# Patient Record
Sex: Male | Born: 1950 | Race: Black or African American | Hispanic: No | Smoking: Former smoker
Health system: Southern US, Community
[De-identification: ages and names within clinical notes are randomized; demographics above are authoritative.]

## PROBLEM LIST (undated history)

## (undated) DIAGNOSIS — E559 Vitamin D deficiency, unspecified: Secondary | ICD-10-CM

## (undated) DIAGNOSIS — R0989 Other specified symptoms and signs involving the circulatory and respiratory systems: Secondary | ICD-10-CM

## (undated) DIAGNOSIS — I5022 Chronic systolic (congestive) heart failure: Secondary | ICD-10-CM

## (undated) DIAGNOSIS — R0602 Shortness of breath: Secondary | ICD-10-CM

## (undated) DIAGNOSIS — L03115 Cellulitis of right lower limb: Secondary | ICD-10-CM

## (undated) DIAGNOSIS — I420 Dilated cardiomyopathy: Secondary | ICD-10-CM

## (undated) DIAGNOSIS — K635 Polyp of colon: Secondary | ICD-10-CM

## (undated) DIAGNOSIS — E669 Obesity, unspecified: Secondary | ICD-10-CM

## (undated) DIAGNOSIS — E7800 Pure hypercholesterolemia, unspecified: Secondary | ICD-10-CM

## (undated) DIAGNOSIS — E782 Mixed hyperlipidemia: Secondary | ICD-10-CM

## (undated) DIAGNOSIS — M79604 Pain in right leg: Secondary | ICD-10-CM

## (undated) DIAGNOSIS — I639 Cerebral infarction, unspecified: Secondary | ICD-10-CM

## (undated) DIAGNOSIS — Z95810 Presence of automatic (implantable) cardiac defibrillator: Secondary | ICD-10-CM

## (undated) DIAGNOSIS — M79605 Pain in left leg: Secondary | ICD-10-CM

## (undated) DIAGNOSIS — I509 Heart failure, unspecified: Secondary | ICD-10-CM

## (undated) DIAGNOSIS — E119 Type 2 diabetes mellitus without complications: Secondary | ICD-10-CM

## (undated) DIAGNOSIS — I10 Essential (primary) hypertension: Secondary | ICD-10-CM

## (undated) DIAGNOSIS — R609 Edema, unspecified: Secondary | ICD-10-CM

## (undated) HISTORY — DX: Dilated cardiomyopathy: I42.0

## (undated) HISTORY — DX: Pain in left leg: M79.605

## (undated) HISTORY — DX: Vitamin D deficiency, unspecified: E55.9

## (undated) HISTORY — PX: TONSILLECTOMY: SUR1361

## (undated) HISTORY — PX: EYE SURGERY: SHX253

## (undated) HISTORY — DX: Other specified symptoms and signs involving the circulatory and respiratory systems: R09.89

## (undated) HISTORY — DX: Shortness of breath: R06.02

## (undated) HISTORY — DX: Pain in right leg: M79.604

## (undated) HISTORY — DX: Chronic systolic (congestive) heart failure: I50.22

## (undated) HISTORY — DX: Edema, unspecified: R60.9

## (undated) HISTORY — DX: Mixed hyperlipidemia: E78.2

## (undated) HISTORY — DX: Polyp of colon: K63.5

## (undated) HISTORY — DX: Pure hypercholesterolemia, unspecified: E78.00

## (undated) HISTORY — DX: Cerebral infarction, unspecified: I63.9

## (undated) HISTORY — DX: Cellulitis of right lower limb: L03.115

## (undated) HISTORY — DX: Essential (primary) hypertension: I10

## (undated) HISTORY — PX: COLONOSCOPY W/ BIOPSIES AND POLYPECTOMY: SHX1376

## (undated) HISTORY — DX: Obesity, unspecified: E66.9

---

## 2014-04-12 DIAGNOSIS — I639 Cerebral infarction, unspecified: Secondary | ICD-10-CM

## 2014-04-12 HISTORY — DX: Cerebral infarction, unspecified: I63.9

## 2015-12-10 ENCOUNTER — Encounter: Payer: Self-pay | Admitting: *Deleted

## 2015-12-11 ENCOUNTER — Ambulatory Visit (INDEPENDENT_AMBULATORY_CARE_PROVIDER_SITE_OTHER): Payer: BLUE CROSS/BLUE SHIELD | Admitting: Cardiology

## 2015-12-11 ENCOUNTER — Encounter: Payer: Self-pay | Admitting: Cardiology

## 2015-12-11 ENCOUNTER — Encounter (INDEPENDENT_AMBULATORY_CARE_PROVIDER_SITE_OTHER): Payer: Self-pay

## 2015-12-11 VITALS — BP 138/72 | HR 81 | Ht 67.0 in | Wt 207.6 lb

## 2015-12-11 DIAGNOSIS — I509 Heart failure, unspecified: Secondary | ICD-10-CM

## 2015-12-11 MED ORDER — LISINOPRIL 20 MG PO TABS: 20.0000 mg | ORAL_TABLET | Freq: Every day | ORAL | 1 refills | Status: DC

## 2015-12-11 MED ORDER — CARVEDILOL 6.25 MG PO TABS: 6.2500 mg | ORAL_TABLET | Freq: Two times a day (BID) | ORAL | 1 refills | Status: DC

## 2015-12-11 NOTE — Patient Instructions (Addendum)
Medication Instructions:  Your physician has recommended you make the following change in your medication:  1) INCREASE Lisinopril 20 mg daily 2) INCREASE Carvedilol to 6.25 mg twice daily  Labwork: None ordered  Testing/Procedures: Your physician has requested that you have an echocardiogram in 3 months (before next office visit with Dr. Elberta Fortis). Echocardiography is a painless test that uses sound waves to create images of your heart. It provides your doctor with information about the size and shape of your heart and how well your heart's chambers and valves are working. This procedure takes approximately one hour. There are no restrictions for this procedure.  Follow-Up: Your physician recommends that you schedule a follow-up appointment in: 3 months with Dr. Elberta Fortis. (after you have completed your echocardiogram)  If you need a refill on your cardiac medications before your next appointment, please call your pharmacy.  Thank you for choosing [facility]!!   Dory Horn, RN  [phone]  Any Other Special Instructions Will Be Listed Below (If Applicable).   Cardioverter Defibrillator Implantation An implantable cardioverter defibrillator (ICD) is a small, lightweight, battery-powered device that is placed (implanted) under the skin in the chest or abdomen. Your caregiver may prescribe an ICD if:  You have had an irregular heart rhythm (arrhythmia) that originated in the lower chambers of the heart (ventricles).  Your heart has been damaged by a disease (such as coronary artery disease) or heart condition (such as a heart attack). An ICD consists of a battery that lasts several years, a small computer called a pulse generator, and wires called leads that go into the heart. It is used to detect and correct two dangerous arrhythmias: a rapid heart rhythm (tachycardia) and an arrhythmia in which the ventricles contract in an uncoordinated way (fibrillation). When an ICD detects  tachycardia, it sends an electrical signal to the heart that restores the heartbeat to normal (cardioversion). This signal is usually painless. If cardioversion does not work or if the ICD detects fibrillation, it delivers a small electrical shock to the heart (defibrillation) to restart the heart. The shock may feel like a strong jolt in the chest.ICDs may be programmed to correct other problems. Sometimes, ICDs are programmed to act as another type of implantable device called a pacemaker. Pacemakers are used to treat a slow heartbeat (bradycardia). LET YOUR CAREGIVER KNOW ABOUT:  Any allergies you have.  All medicines you are taking, including vitamins, herbs, eyedrops, and over-the-counter medicines and creams.  Previous problems you or members of your family have had with the use of anesthetics.  Any blood disorders you have had.  Other health problems you have. RISKS AND COMPLICATIONS Generally, the procedure to implant an ICD is safe. However, as with any surgical procedure, complications can occur. Possible complications associated with implanting an ICD include:  Swelling, bleeding, or bruising at the site where the ICD was implanted.  Infection at the site where the ICD was implanted.  A reaction to medicine used during the procedure.  Nerve, heart, or blood vessel damage.  Blood clots. BEFORE THE PROCEDURE  You may need to have blood tests, heart tests, or a chest X-ray done before the day of the procedure.  Ask your caregiver about changing or stopping your regular medicines.  Make plans to have someone drive you home. You may need to stay in the hospital overnight after the procedure.  Stop smoking at least 24 hours before the procedure.  Take a bath or shower the night before the procedure. You  may need to scrub your chest or abdomen with a special type of soap.  Do not eat or drink before your procedure for as long as directed by your caregiver. Ask if it is okay  to take any needed medicine with a small sip of water. PROCEDURE  The procedure to implant an ICD in your chest or abdomen is usually done at a hospital in a room that has a large X-ray machine called a fluoroscope. The machine will be above you during the procedure. It will help your caregiver see your heart during the procedure. Implanting an ICD usually takes 1-3 hours. Before the procedure:   Small monitors will be put on your body. They will be used to check your heart, blood pressure, and oxygen level.  A needle will be put into a vein in your hand or arm. This is called an intravenous (IV) access tube. Fluids and medicine will flow directly into your body through the IV tube.  Your chest or abdomen will be cleaned with a germ-killing (antiseptic) solution. The area may be shaved.  You may be given medicine to help you relax (sedative).  You will be given a medicine called a local anesthetic. This medicine will make the surgical site numb while the ICD is implanted. You will be sleepy but awake during the procedure. After you are numb the procedure will begin. The caregiver will:  Make a small cut (incision). This will make a pocket deep under your skin that will hold the pulse generator.  Guide the leads through a large blood vessel into your heart and attach them to the heart muscles. Depending on the ICD, the leads may go into one ventricle or they may go to both ventricles and into an upper chamber of the heart (atrium).  Test the ICD.  Close the incision with stitches, glue, or staples. AFTER THE PROCEDURE  You may feel pain. Some pain is normal. It may last a few days.  You may stay in a recovery area until the local anesthetic has worn off. Your blood pressure and pulse will be checked often. You will be taken to a room where your heart will be monitored.  A chest X-ray will be taken. This is done to check that the cardioverter defibrillator is in the right place.  You may  stay in the hospital overnight.  A slight bump may be seen over the skin where the ICD was placed. Sometimes, it is possible to feel the ICD under the skin. This is normal.  In the months and years afterward, your caregiver will check the device, the leads, and the battery every few months. Eventually, when the battery is low, the ICD will be replaced.   This information is not intended to replace advice given to you by your health care provider. Make sure you discuss any questions you have with your health care provider.   Document Released: 12/19/2001 Document Revised: 01/17/2013 Document Reviewed: 04/17/2012 Elsevier Interactive Patient Education [employer]

## 2015-12-11 NOTE — Progress Notes (Signed)
Electrophysiology Office Note   Date:  12/11/2015   ID:  Timothy Green, DOB 06-22-1950, MRN [id number]  PCP:  Doreen Salvage, PA-C  Cardiologist:  Hanley Hays Primary Electrophysiologist:  Shakea Isip Jorja Loa, MD    Chief Complaint  Patient presents with  . Advice Only    CHF     History of Present Illness: Timothy Green is a 65 y.o. male who presents today for electrophysiology evaluation.   History of controlled diabetes, hyperlipidemia, hypertension, and systolic heart failure. He is apparently had a known cardiomyopathy for the last 10 years but was lost to cardiology follow-up. It is reportedly nonischemic but the patient has chronic hypertension and diabetes. He takes Coumadin after a CVA of unclear etiology. He had an echocardiogram that showed an EF of 25-30% with decreased RV systolic function.  Today, he denies symptoms of palpitations, chest pain, shortness of breath, orthopnea, PND, claudication, dizziness, presyncope, syncope, bleeding, or neurologic sequela. The patient is tolerating medications without difficulties and is otherwise without complaint today.    Past Medical History:  Diagnosis Date  . Bilateral lower extremity pain   . Bruit (arterial)   . Cardiomyopathy, dilated, nonischemic (HCC)   . Cellulitis of right lower extremity   . Chronic systolic heart failure (HCC)   . Colon polyps   . Controlled diabetes mellitus (HCC)   . Edema   . Hypertension, essential   . Ischemic stroke (HCC)   . Mixed hyperlipidemia   . Obesity   . Pure hypercholesterolemia   . Shortness of breath   . Vitamin D deficiency    History reviewed. No pertinent surgical history.   Current Outpatient Prescriptions  Medication Sig Dispense Refill  . atorvastatin (LIPITOR) 40 MG tablet Take 40 mg by mouth daily.    . furosemide (LASIX) 40 MG tablet Take 60 mg by mouth.    Marland Kitchen glipiZIDE (GLUCOTROL) 10 MG tablet Take 10 mg by mouth daily before breakfast.    . MULTIPLE VITAMIN PO  Take 1 tablet by mouth daily.    . potassium chloride (K-DUR) 10 MEQ tablet Take 10 mEq by mouth 2 (two) times daily.    . WARFARIN SODIUM PO Take by mouth as directed. Per [facility]     No current facility-administered medications for this visit.     Allergies:   Review of patient's allergies indicates no known allergies.   Social History:  The patient  reports that he has quit smoking. He has never used smokeless tobacco. He reports that he drinks alcohol. He reports that he does not use drugs.   Family History:  The patient's family history includes Diabetes Mellitus II in his mother; Heart disease in his father and mother; Hypertension in his mother.    ROS:  Please see the history of present illness.   Otherwise, review of systems is positive for leg swelling.   All other systems are reviewed and negative.    PHYSICAL EXAM: VS:  BP 138/72   Pulse 81   Ht 5\' 7"  (1.702 m)   Wt 207 lb 9.6 oz (94.2 kg)   BMI 32.51 kg/m  , BMI Body mass index is 32.51 kg/m. GEN: Well nourished, well developed, in no acute distress  HEENT: normal  Neck: no JVD, carotid bruits, or masses Cardiac: RRR; no murmurs, rubs, or gallops,no edema  Respiratory:  clear to auscultation bilaterally, normal work of breathing GI: soft, nontender, nondistended, + BS MS: no deformity or atrophy  Skin: warm and dry Neuro:  Strength and sensation are intact Psych: euthymic mood, full affect  EKG:  EKG is ordered today. Personal review of the ekg ordered shows sinus rhythm, low voltage, PVC, nonspecific T wave abnormality  Recent Labs: No results found for requested labs within last 8760 hours.    Lipid Panel  No results found for: CHOL, TRIG, HDL, CHOLHDL, VLDL, LDLCALC, LDLDIRECT   Wt Readings from Last 3 Encounters:  12/11/15 207 lb 9.6 oz (94.2 kg)      Other studies Reviewed: Additional studies/ records that were reviewed today include: TTE 12/02/15  Review of the above records today  demonstrates:  Moderately dilated left ventricle, EF 25-30% Markedly dilated left atrium Mildly enlarged right ventricle, moderately impaired  mild pulmonary hypertension Normal valvular anatomy and function   ASSESSMENT AND PLAN:  1.  Chronic systolic heart failure: It appears that he has had heart failure for quite some time, though he is not on optimized medical therapy for his heart failure today. We did discuss the option of defibrillator. We discussed the risks and benefits of the procedure. Risks include bleeding, infection, tamponade, and heart block. We also discussed the option of adjusting medications to see if his EF would improve. We Timothy Green increase his Coreg to 6.25 twice daily and increase his lisinopril to 20 mg. We Timothy Green see him back in 3 months with an echocardiogram to further determine if his EF has improved. If it is not we Timothy Green plan for defibrillator placement.  2. Hypertension: Upper limits of normal today. Adjusting carvedilol and lisinopril for his heart failure.  3. Hyperlipidemia: Continue atorvastatin    Current medicines are reviewed at length with the patient today.   The patient does not have concerns regarding his medicines.  The following changes were made today:  Increase coreg, lisinopril  Labs/ tests ordered today include:  No orders of the defined types were placed in this encounter.    Disposition:   FU with Timothy Green 3 months  Signed, Timothy Green Jorja LoaMartin Esiah Bazinet, MD  12/11/2015 11:03 AM     [facility] [address] [place] [address] [phone] (office) [phone] (fax)

## 2016-03-08 ENCOUNTER — Other Ambulatory Visit (HOSPITAL_COMMUNITY): Payer: BLUE CROSS/BLUE SHIELD

## 2016-03-11 ENCOUNTER — Ambulatory Visit: Payer: BLUE CROSS/BLUE SHIELD | Admitting: Cardiology

## 2016-04-28 ENCOUNTER — Telehealth (HOSPITAL_COMMUNITY): Payer: Self-pay | Admitting: Radiology

## 2016-04-28 NOTE — Telephone Encounter (Signed)
Spoke with patient he will call back to reschedule.

## 2016-04-29 ENCOUNTER — Other Ambulatory Visit (HOSPITAL_COMMUNITY): Payer: BLUE CROSS/BLUE SHIELD

## 2016-05-13 ENCOUNTER — Telehealth (HOSPITAL_COMMUNITY): Payer: Self-pay | Admitting: Radiology

## 2016-05-13 ENCOUNTER — Encounter (HOSPITAL_COMMUNITY): Payer: Self-pay | Admitting: Radiology

## 2016-05-13 NOTE — Telephone Encounter (Signed)
left message to schedule echocardiogram  

## 2016-05-19 ENCOUNTER — Telehealth (HOSPITAL_COMMUNITY): Payer: Self-pay | Admitting: Cardiology

## 2016-06-09 ENCOUNTER — Other Ambulatory Visit: Payer: Self-pay | Admitting: Cardiology

## 2016-06-10 NOTE — Telephone Encounter (Signed)
Left patient message to call office to discuss scheduling overdue OV + echo

## 2016-09-09 NOTE — Telephone Encounter (Signed)
Left another message.

## 2016-10-27 ENCOUNTER — Encounter: Payer: Self-pay | Admitting: Cardiology

## 2016-10-27 NOTE — Telephone Encounter (Signed)
Certified letter mailed to home address asking pt to call and reschedule echo and OV.

## 2016-11-16 ENCOUNTER — Encounter: Payer: Self-pay | Admitting: Cardiology

## 2016-12-01 ENCOUNTER — Other Ambulatory Visit: Payer: Self-pay | Admitting: Cardiology

## 2016-12-05 ENCOUNTER — Other Ambulatory Visit: Payer: Self-pay | Admitting: Cardiology

## 2016-12-20 ENCOUNTER — Telehealth: Payer: Self-pay

## 2016-12-20 NOTE — Telephone Encounter (Signed)
Sent notes to scheduling 

## 2017-01-05 ENCOUNTER — Encounter: Payer: Self-pay | Admitting: Cardiology

## 2017-01-05 ENCOUNTER — Encounter: Payer: Self-pay | Admitting: *Deleted

## 2017-01-05 ENCOUNTER — Ambulatory Visit (INDEPENDENT_AMBULATORY_CARE_PROVIDER_SITE_OTHER): Payer: BLUE CROSS/BLUE SHIELD | Admitting: Cardiology

## 2017-01-05 ENCOUNTER — Other Ambulatory Visit: Payer: Self-pay | Admitting: Cardiology

## 2017-01-05 VITALS — BP 129/83 | HR 72 | Ht 67.0 in | Wt 209.8 lb

## 2017-01-05 DIAGNOSIS — E785 Hyperlipidemia, unspecified: Secondary | ICD-10-CM | POA: Insufficient documentation

## 2017-01-05 DIAGNOSIS — E782 Mixed hyperlipidemia: Secondary | ICD-10-CM

## 2017-01-05 DIAGNOSIS — I10 Essential (primary) hypertension: Secondary | ICD-10-CM | POA: Diagnosis not present

## 2017-01-05 DIAGNOSIS — I5022 Chronic systolic (congestive) heart failure: Secondary | ICD-10-CM | POA: Diagnosis not present

## 2017-01-05 DIAGNOSIS — I428 Other cardiomyopathies: Secondary | ICD-10-CM

## 2017-01-05 NOTE — Patient Instructions (Addendum)
Medication Instructions:    Your physician recommends that you continue on your current medications as directed. Please refer to the Current Medication list given to you today.  --- If you need a refill on your cardiac medications before your next appointment, please call your pharmacy. ---  Labwork:  None ordered  Testing/Procedures: Your physician has recommended that you have a defibrillator inserted. An implantable cardioverter defibrillator (ICD) is a small device that is placed in your chest or, in rare cases, your abdomen. This device uses electrical pulses or shocks to help control life-threatening, irregular heartbeats that could lead the heart to suddenly stop beating (sudden cardiac arrest). Leads are attached to the ICD that goes into your heart. This is done in the hospital and usually requires an overnight stay. Please see the instruction sheet given to you today for more information.  Follow-Up:  Your physician recommends that you schedule a follow-up appointment on 10/24 with Dr. Elberta Fortis.    Your physician recommends that you schedule a wound check appointment 10-14 days, after your procedure on 02/08/2017, with the device clinic in [place] office.   Your physician recommends that you schedule a follow up appointment in 3 months, after your procedure on 02/08/2017, with Dr. Elberta Fortis in [facility] office.  Thank you for choosing [facility]!!   Dory Horn, RN [phone]  Any Other Special Instructions Will Be Listed Below (If Applicable).   Cardioverter Defibrillator Implantation An implantable cardioverter defibrillator (ICD) is a small, lightweight, battery-powered device that is placed (implanted) under the skin in the chest or abdomen. Your caregiver may prescribe an ICD if:  You have had an irregular heart rhythm (arrhythmia) that originated in the lower chambers of the heart (ventricles).  Your heart has been damaged by a disease (such as coronary  artery disease) or heart condition (such as a heart attack). An ICD consists of a battery that lasts several years, a small computer called a pulse generator, and wires called leads that go into the heart. It is used to detect and correct two dangerous arrhythmias: a rapid heart rhythm (tachycardia) and an arrhythmia in which the ventricles contract in an uncoordinated way (fibrillation). When an ICD detects tachycardia, it sends an electrical signal to the heart that restores the heartbeat to normal (cardioversion). This signal is usually painless. If cardioversion does not work or if the ICD detects fibrillation, it delivers a small electrical shock to the heart (defibrillation) to restart the heart. The shock may feel like a strong jolt in the chest.ICDs may be programmed to correct other problems. Sometimes, ICDs are programmed to act as another type of implantable device called a pacemaker. Pacemakers are used to treat a slow heartbeat (bradycardia). LET YOUR CAREGIVER KNOW ABOUT:  Any allergies you have.  All medicines you are taking, including vitamins, herbs, eyedrops, and over-the-counter medicines and creams.  Previous problems you or members of your family have had with the use of anesthetics.  Any blood disorders you have had.  Other health problems you have. RISKS AND COMPLICATIONS Generally, the procedure to implant an ICD is safe. However, as with any surgical procedure, complications can occur. Possible complications associated with implanting an ICD include:  Swelling, bleeding, or bruising at the site where the ICD was implanted.  Infection at the site where the ICD was implanted.  A reaction to medicine used during the procedure.  Nerve, heart, or blood vessel damage.  Blood clots. BEFORE THE PROCEDURE  You may need to have blood  tests, heart tests, or a chest X-ray done before the day of the procedure.  Ask your caregiver about changing or stopping your regular  medicines.  Make plans to have someone drive you home. You may need to stay in the hospital overnight after the procedure.  Stop smoking at least 24 hours before the procedure.  Take a bath or shower the night before the procedure. You may need to scrub your chest or abdomen with a special type of soap.  Do not eat or drink before your procedure for as long as directed by your caregiver. Ask if it is okay to take any needed medicine with a small sip of water. PROCEDURE  The procedure to implant an ICD in your chest or abdomen is usually done at a hospital in a room that has a large X-ray machine called a fluoroscope. The machine will be above you during the procedure. It will help your caregiver see your heart during the procedure. Implanting an ICD usually takes 1-3 hours. Before the procedure:   Small monitors will be put on your body. They will be used to check your heart, blood pressure, and oxygen level.  A needle will be put into a vein in your hand or arm. This is called an intravenous (IV) access tube. Fluids and medicine will flow directly into your body through the IV tube.  Your chest or abdomen will be cleaned with a germ-killing (antiseptic) solution. The area may be shaved.  You may be given medicine to help you relax (sedative).  You will be given a medicine called a local anesthetic. This medicine will make the surgical site numb while the ICD is implanted. You will be sleepy but awake during the procedure. After you are numb the procedure will begin. The caregiver will:  Make a small cut (incision). This will make a pocket deep under your skin that will hold the pulse generator.  Guide the leads through a large blood vessel into your heart and attach them to the heart muscles. Depending on the ICD, the leads may go into one ventricle or they may go to both ventricles and into an upper chamber of the heart (atrium).  Test the ICD.  Close the incision with stitches, glue,  or staples. AFTER THE PROCEDURE  You may feel pain. Some pain is normal. It may last a few days.  You may stay in a recovery area until the local anesthetic has worn off. Your blood pressure and pulse will be checked often. You will be taken to a room where your heart will be monitored.  A chest X-ray will be taken. This is done to check that the cardioverter defibrillator is in the right place.  You may stay in the hospital overnight.  A slight bump may be seen over the skin where the ICD was placed. Sometimes, it is possible to feel the ICD under the skin. This is normal.  In the months and years afterward, your caregiver will check the device, the leads, and the battery every few months. Eventually, when the battery is low, the ICD will be replaced.   This information is not intended to replace advice given to you by your health care provider. Make sure you discuss any questions you have with your health care provider.   Document Released: 12/19/2001 Document Revised: 01/17/2013 Document Reviewed: 04/17/2012 Elsevier Interactive Patient Education [employer]

## 2017-01-05 NOTE — Progress Notes (Signed)
Electrophysiology Office Note   Date:  01/05/2017   ID:  Timothy Green, DOB [birth year], MRN [id number]  PCP:  Doreen Salvage, PA-C  Cardiologist:  Hanley Hays Primary Electrophysiologist:  Carollyn Etcheverry Jorja Loa, MD    Chief Complaint  Patient presents with  . Follow-up    Chronic systolic HF/abnormal stress     History of Present Illness: Timothy Green is a 66 y.o. male who presents today for electrophysiology evaluation.   History of controlled diabetes, hyperlipidemia, hypertension, and systolic heart failure. He is apparently had a known cardiomyopathy for the last 10 years but was lost to cardiology follow-up. It is reportedly nonischemic but the patient has chronic hypertension and diabetes. He takes Coumadin after a CVA of unclear etiology. At his last visit he was not on optimal medical therapy and thus lisinopril was added. Repeat echocardiogram was unchanged with a Myoview showing mainly fixed defects with some mild peri-infarct anterior ischemia.  Today, denies symptoms of palpitations, chest pain, shortness of breath, orthopnea, PND, lower extremity edema, claudication, dizziness, presyncope, syncope, bleeding, or neurologic sequela. The patient is tolerating medications without difficulties. He was started on chest ON is felt much better since that time. He is having much less weakness and fatigue. His volume status is improved after increases of his carvedilol and his Lasix.     Past Medical History:  Diagnosis Date  . Bilateral lower extremity pain   . Bruit (arterial)   . Cardiomyopathy, dilated, nonischemic (HCC)   . Cellulitis of right lower extremity   . Chronic systolic heart failure (HCC)   . Colon polyps   . Controlled diabetes mellitus (HCC)   . Edema   . Hypertension, essential   . Ischemic stroke (HCC)   . Mixed hyperlipidemia   . Obesity   . Pure hypercholesterolemia   . Shortness of breath   . Vitamin D deficiency    No past surgical history on  file.   Current Outpatient Prescriptions  Medication Sig Dispense Refill  . atorvastatin (LIPITOR) 40 MG tablet Take 40 mg by mouth daily.    . carvedilol (COREG) 6.25 MG tablet TAKE 1 TABLET BY MOUTH TWICE DAILY 60 tablet 0  . clopidogrel (PLAVIX) 75 MG tablet Take 75 mg by mouth daily.    . furosemide (LASIX) 40 MG tablet Take 60 mg by mouth.    Marland Kitchen glipiZIDE (GLUCOTROL) 10 MG tablet Take 10 mg by mouth daily before breakfast.    . MULTIPLE VITAMIN PO Take 1 tablet by mouth daily.    . potassium chloride (K-DUR) 10 MEQ tablet Take 10 mEq by mouth 2 (two) times daily.    . sacubitril-valsartan (ENTRESTO) 49-51 MG Take 1 tablet by mouth 2 (two) times daily.     No current facility-administered medications for this visit.     Allergies:   Patient has no known allergies.   Social History:  The patient  reports that he has quit smoking. He has never used smokeless tobacco. He reports that he drinks alcohol. He reports that he does not use drugs.   Family History:  The patient's family history includes Diabetes Mellitus II in his mother; Heart disease in his father and mother; Hypertension in his mother.    ROS:  Please see the history of present illness.   Otherwise, review of systems is positive for Fatigue.   All other systems are reviewed and negative.   PHYSICAL EXAM: VS:  BP 129/83   Pulse 72   Ht 5\' 7"  (1.702  m)   Wt 209 lb 12.8 oz (95.2 kg)   BMI 32.86 kg/m  , BMI Body mass index is 32.86 kg/m. GEN: Well nourished, well developed, in no acute distress  HEENT: normal  Neck: no JVD, carotid bruits, or masses Cardiac: RRR; no murmurs, rubs, or gallops,no edema  Respiratory:  clear to auscultation bilaterally, normal work of breathing GI: soft, nontender, nondistended, + BS MS: no deformity or atrophy  Skin: warm and dry Neuro:  Strength and sensation are intact Psych: euthymic mood, full affect  EKG:  EKG is ordered today. Personal review of the ekg ordered shows sinus  rhythm, rate 72, PVC, diffuse T-wave abnormalities, low voltage   Recent Labs: No results found for requested labs within last 8760 hours.    Lipid Panel  No results found for: CHOL, TRIG, HDL, CHOLHDL, VLDL, LDLCALC, LDLDIRECT   Wt Readings from Last 3 Encounters:  01/05/17 209 lb 12.8 oz (95.2 kg)  12/11/15 207 lb 9.6 oz (94.2 kg)      Other studies Reviewed: Additional studies/ records that were reviewed today include: TTE 12/17/16 Review of the above records today demonstrates:  Moderate concentric LVH, ejection fraction 20-25% Moderately dilated left atrium Mildly dilated right ventricle Right ventricular systolic function severely impaired Severe pulmonary hypertension PA pressure 64 mmHg  Myoview 12/18/15 Abnormal stress EKG results showing moderate apical and lateral nontransmural scar and mild to moderate anterior nontransmural scar with peri-infarct ischemia, ejection fraction 26% moderate fixed apical and lateral perfusion defects and mild to moderate partially reversible anterior perfusion defect    ASSESSMENT AND PLAN:  1.  Chronic systolic heart failure: Currently on optimal medical therapy with carvedilol and Entresto. NYHA class II symptoms. Ejection fraction remains decreased. Discussed ICD implantation. Risks and benefits were discussed. Risks include bleeding, tamponade, infection, and pneumothorax. He understands these risks and has agreed to the procedure.  2. Hypertension: Well-controlled, no changes  3. Hyperlipidemia: Continue atorvastatin    Current medicines are reviewed at length with the patient today.   The patient does not have concerns regarding his medicines.  The following changes were made today:  None  Labs/ tests ordered today include:  No orders of the defined types were placed in this encounter.    Disposition:   FU with Ashyra Cantin 3 months  Signed, Alfie Rideaux Jorja Loa, MD  01/05/2017 2:00 PM     [facility] [address] [place] [address] [phone] (office) [phone] (fax)

## 2017-02-02 ENCOUNTER — Encounter: Payer: Self-pay | Admitting: Cardiology

## 2017-02-02 ENCOUNTER — Ambulatory Visit (INDEPENDENT_AMBULATORY_CARE_PROVIDER_SITE_OTHER): Payer: BLUE CROSS/BLUE SHIELD | Admitting: Cardiology

## 2017-02-02 VITALS — BP 137/83 | HR 67 | Ht 67.0 in | Wt 223.1 lb

## 2017-02-02 DIAGNOSIS — I5022 Chronic systolic (congestive) heart failure: Secondary | ICD-10-CM | POA: Diagnosis not present

## 2017-02-02 DIAGNOSIS — E782 Mixed hyperlipidemia: Secondary | ICD-10-CM

## 2017-02-02 DIAGNOSIS — I428 Other cardiomyopathies: Secondary | ICD-10-CM

## 2017-02-02 DIAGNOSIS — I10 Essential (primary) hypertension: Secondary | ICD-10-CM

## 2017-02-02 DIAGNOSIS — Z01812 Encounter for preprocedural laboratory examination: Secondary | ICD-10-CM
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
## 2017-02-02 NOTE — Patient Instructions (Addendum)
Medication Instructions:  Your physician recommends that you continue on your current medications as directed. Please refer to the Current Medication list given to you today.  Labwork: Pre procedure labs today: BMET & CBC w/ diff  Testing/Procedures: None ordered  Follow-Up: Keep your currently scheduled post procedure appointments.  -- If you need a refill on your cardiac medications before your next appointment, please call your pharmacy. --  Thank you for choosing [facility]!!   Dory Horn, RN [phone]

## 2017-02-03 LAB — BASIC METABOLIC PANEL
BUN/Creatinine Ratio: 19 (ref 10–24)
BUN: 29 mg/dL — ABNORMAL HIGH (ref 8–27)
CALCIUM: 8.8 mg/dL (ref 8.6–10.2)
CHLORIDE: 103 mmol/L (ref 96–106)
CO2: 28 mmol/L (ref 20–29)
Creatinine, Ser: 1.55 mg/dL — ABNORMAL HIGH (ref 0.76–1.27)
GFR calc Af Amer: 54 mL/min/{1.73_m2} — ABNORMAL LOW (ref >59)
GFR calc non Af Amer: 46 mL/min/{1.73_m2} — ABNORMAL LOW (ref >59)
GLUCOSE: 243 mg/dL — AB (ref 65–99)
POTASSIUM: 4.4 mmol/L (ref 3.5–5.2)
SODIUM: 142 mmol/L (ref 134–144)

## 2017-02-03 LAB — CBC WITH DIFFERENTIAL/PLATELET
BASOS ABS: 0 10*3/uL (ref 0.0–0.2)
BASOS: 0 %
EOS (ABSOLUTE): 0.3 10*3/uL (ref 0.0–0.4)
Eos: 4 %
Hematocrit: 36.1 % — ABNORMAL LOW (ref 37.5–51.0)
Hemoglobin: 12 g/dL — ABNORMAL LOW (ref 13.0–17.7)
IMMATURE GRANS (ABS): 0 10*3/uL (ref 0.0–0.1)
IMMATURE GRANULOCYTES: 0 %
LYMPHS: 13 %
Lymphocytes Absolute: 0.9 10*3/uL (ref 0.7–3.1)
MCH: 31 pg (ref 26.6–33.0)
MCHC: 33.2 g/dL (ref 31.5–35.7)
MCV: 93 fL (ref 79–97)
MONOS ABS: 0.5 10*3/uL (ref 0.1–0.9)
Monocytes: 7 %
NEUTROS PCT: 76 %
Neutrophils Absolute: 5.5 10*3/uL (ref 1.4–7.0)
PLATELETS: 211 10*3/uL (ref 150–379)
RBC: 3.87 x10E6/uL — ABNORMAL LOW (ref 4.14–5.80)
RDW: 15 % (ref 12.3–15.4)
WBC: 7.2 10*3/uL (ref 3.4–10.8)

## 2017-02-08 ENCOUNTER — Encounter (HOSPITAL_COMMUNITY): Payer: Self-pay | Admitting: Cardiology

## 2017-02-08 ENCOUNTER — Ambulatory Visit (HOSPITAL_COMMUNITY)
Admission: RE | Admit: 2017-02-08 | Discharge: 2017-02-09 | Disposition: A | Discharge status: Home / Self Care | Payer: Medicare Other | Source: Ambulatory Visit | Attending: Cardiology | Admitting: Cardiology

## 2017-02-08 ENCOUNTER — Encounter (HOSPITAL_COMMUNITY)
Admission: RE | Disposition: A | Discharge status: Home / Self Care | Payer: Self-pay | Source: Ambulatory Visit | Attending: Cardiology

## 2017-02-08 DIAGNOSIS — I428 Other cardiomyopathies: Secondary | ICD-10-CM

## 2017-02-08 DIAGNOSIS — Z8673 Personal history of transient ischemic attack (TIA), and cerebral infarction without residual deficits: Secondary | ICD-10-CM | POA: Insufficient documentation

## 2017-02-08 DIAGNOSIS — I110 Hypertensive heart disease with heart failure: Secondary | ICD-10-CM | POA: Diagnosis not present

## 2017-02-08 DIAGNOSIS — I255 Ischemic cardiomyopathy: Secondary | ICD-10-CM | POA: Diagnosis not present

## 2017-02-08 DIAGNOSIS — I5022 Chronic systolic (congestive) heart failure: Secondary | ICD-10-CM | POA: Insufficient documentation

## 2017-02-08 DIAGNOSIS — Z95818 Presence of other cardiac implants and grafts: Secondary | ICD-10-CM

## 2017-02-08 DIAGNOSIS — I2510 Atherosclerotic heart disease of native coronary artery without angina pectoris: Secondary | ICD-10-CM | POA: Diagnosis not present

## 2017-02-08 DIAGNOSIS — E785 Hyperlipidemia, unspecified: Secondary | ICD-10-CM | POA: Diagnosis not present

## 2017-02-08 DIAGNOSIS — I509 Heart failure, unspecified: Secondary | ICD-10-CM

## 2017-02-08 DIAGNOSIS — Z006 Encounter for examination for normal comparison and control in clinical research program: Secondary | ICD-10-CM | POA: Insufficient documentation

## 2017-02-08 DIAGNOSIS — Z95810 Presence of automatic (implantable) cardiac defibrillator: Secondary | ICD-10-CM

## 2017-02-08 HISTORY — DX: Presence of automatic (implantable) cardiac defibrillator: Z95.810

## 2017-02-08 HISTORY — PX: ICD IMPLANT: EP1208

## 2017-02-08 HISTORY — DX: Type 2 diabetes mellitus without complications: E11.9

## 2017-02-08 HISTORY — DX: Heart failure, unspecified: I50.9

## 2017-02-08 LAB — GLUCOSE, CAPILLARY
GLUCOSE-CAPILLARY: 131 mg/dL — AB (ref 65–99)
GLUCOSE-CAPILLARY: 114 mg/dL — AB (ref 65–99)
GLUCOSE-CAPILLARY: 100 mg/dL — AB (ref 65–99)
GLUCOSE-CAPILLARY: 139 mg/dL — AB (ref 65–99)
Glucose-Capillary: 184 mg/dL — ABNORMAL HIGH (ref 65–99)

## 2017-02-08 LAB — SURGICAL PCR SCREEN
MRSA, PCR: POSITIVE — AB
Staphylococcus aureus: POSITIVE — AB

## 2017-02-08 SURGERY — ICD IMPLANT

## 2017-02-08 MED ORDER — CEFAZOLIN SODIUM-DEXTROSE 1-4 GM/50ML-% IV SOLN
1.0000 g | Freq: Four times a day (QID) | INTRAVENOUS | Status: AC
  Administered 2017-02-08 – 2017-02-09 (×3): 1 g via INTRAVENOUS
  Filled 2017-02-08 (×3): qty 50

## 2017-02-08 MED ORDER — POTASSIUM CHLORIDE ER 10 MEQ PO TBCR
20.0000 meq | EXTENDED_RELEASE_TABLET | ORAL | Status: DC
  Filled 2017-02-08 (×2): qty 2

## 2017-02-08 MED ORDER — MIDAZOLAM HCL 5 MG/5ML IJ SOLN
INTRAMUSCULAR | Status: AC
  Filled 2017-02-08: qty 5

## 2017-02-08 MED ORDER — HEPARIN (PORCINE) IN NACL 2-0.9 UNIT/ML-% IJ SOLN
INTRAMUSCULAR | Status: AC
  Filled 2017-02-08: qty 500

## 2017-02-08 MED ORDER — ONDANSETRON HCL 4 MG/2ML IJ SOLN: 4.0000 mg | Freq: Four times a day (QID) | INTRAMUSCULAR | Status: DC | PRN

## 2017-02-08 MED ORDER — CLOPIDOGREL BISULFATE 75 MG PO TABS
75.0000 mg | ORAL_TABLET | Freq: Every day | ORAL | Status: DC
  Administered 2017-02-08 – 2017-02-09 (×2): 75 mg via ORAL
  Filled 2017-02-08 (×2): qty 1

## 2017-02-08 MED ORDER — CEFAZOLIN SODIUM-DEXTROSE 2-4 GM/100ML-% IV SOLN
2.0000 g | INTRAVENOUS | Status: AC
  Administered 2017-02-08: 2 g via INTRAVENOUS
  Filled 2017-02-08: qty 100

## 2017-02-08 MED ORDER — FUROSEMIDE 40 MG PO TABS
40.0000 mg | ORAL_TABLET | Freq: Two times a day (BID) | ORAL | Status: DC
  Administered 2017-02-08 – 2017-02-09 (×2): 40 mg via ORAL
  Filled 2017-02-08 (×2): qty 1

## 2017-02-08 MED ORDER — GENTAMICIN SULFATE 40 MG/ML IJ SOLN
80.0000 mg | INTRAMUSCULAR | Status: AC
  Administered 2017-02-08: 80 mg
  Filled 2017-02-08: qty 2

## 2017-02-08 MED ORDER — SODIUM CHLORIDE 0.9 % IR SOLN
Status: AC
  Filled 2017-02-08: qty 2

## 2017-02-08 MED ORDER — SACUBITRIL-VALSARTAN 97-103 MG PO TABS
1.0000 | ORAL_TABLET | Freq: Two times a day (BID) | ORAL | Status: DC
  Administered 2017-02-08 – 2017-02-09 (×2): 1 via ORAL
  Filled 2017-02-08 (×2): qty 1

## 2017-02-08 MED ORDER — CARVEDILOL 12.5 MG PO TABS
12.5000 mg | ORAL_TABLET | Freq: Two times a day (BID) | ORAL | Status: DC
  Administered 2017-02-08 – 2017-02-09 (×2): 12.5 mg via ORAL
  Filled 2017-02-08 (×2): qty 1

## 2017-02-08 MED ORDER — ACETAMINOPHEN 500 MG PO TABS: 1000.0000 mg | ORAL_TABLET | Freq: Every day | ORAL | Status: DC | PRN

## 2017-02-08 MED ORDER — MUPIROCIN 2 % EX OINT
1.0000 "application " | TOPICAL_OINTMENT | Freq: Once | CUTANEOUS | Status: AC
  Administered 2017-02-08: 1 via TOPICAL
  Filled 2017-02-08: qty 22

## 2017-02-08 MED ORDER — SODIUM CHLORIDE 0.9 % IV SOLN
INTRAVENOUS | Status: DC
  Administered 2017-02-08: 07:00:00 via INTRAVENOUS

## 2017-02-08 MED ORDER — POTASSIUM CHLORIDE CRYS ER 10 MEQ PO TBCR: 10.0000 meq | EXTENDED_RELEASE_TABLET | ORAL | Status: DC

## 2017-02-08 MED ORDER — FENTANYL CITRATE (PF) 100 MCG/2ML IJ SOLN
INTRAMUSCULAR | Status: AC
  Filled 2017-02-08: qty 2

## 2017-02-08 MED ORDER — ACETAMINOPHEN 325 MG PO TABS
325.0000 mg | ORAL_TABLET | ORAL | Status: DC | PRN
  Administered 2017-02-09: 07:00:00 650 mg via ORAL
  Filled 2017-02-08: qty 2

## 2017-02-08 MED ORDER — MIDAZOLAM HCL 5 MG/5ML IJ SOLN
INTRAMUSCULAR | Status: DC | PRN
  Administered 2017-02-08: 1 mg via INTRAVENOUS
  Administered 2017-02-08: 2 mg via INTRAVENOUS

## 2017-02-08 MED ORDER — MUPIROCIN 2 % EX OINT
TOPICAL_OINTMENT | CUTANEOUS | Status: AC
  Administered 2017-02-08: 1 via TOPICAL
  Filled 2017-02-08: qty 22

## 2017-02-08 MED ORDER — FENTANYL CITRATE (PF) 100 MCG/2ML IJ SOLN
INTRAMUSCULAR | Status: DC | PRN
  Administered 2017-02-08: 25 ug via INTRAVENOUS

## 2017-02-08 MED ORDER — CEFAZOLIN SODIUM-DEXTROSE 2-4 GM/100ML-% IV SOLN
INTRAVENOUS | Status: AC
  Filled 2017-02-08: qty 100

## 2017-02-08 MED ORDER — ATORVASTATIN CALCIUM 40 MG PO TABS
40.0000 mg | ORAL_TABLET | Freq: Every evening | ORAL | Status: DC
  Administered 2017-02-08: 18:00:00 40 mg via ORAL
  Filled 2017-02-08: qty 1

## 2017-02-08 MED ORDER — BUPIVACAINE HCL (PF) 0.25 % IJ SOLN
INTRAMUSCULAR | Status: DC | PRN
  Administered 2017-02-08: 35 mL

## 2017-02-08 MED ORDER — GLIPIZIDE 5 MG PO TABS
10.0000 mg | ORAL_TABLET | Freq: Every day | ORAL | Status: DC
Start: 2017-02-09 — End: 2017-02-09
  Administered 2017-02-09: 10 mg via ORAL
  Filled 2017-02-08: qty 2

## 2017-02-08 MED ORDER — BUPIVACAINE HCL (PF) 0.25 % IJ SOLN
INTRAMUSCULAR | Status: AC
  Filled 2017-02-08: qty 60

## 2017-02-08 MED ORDER — HEPARIN (PORCINE) IN NACL 2-0.9 UNIT/ML-% IJ SOLN
INTRAMUSCULAR | Status: AC | PRN
  Administered 2017-02-08: 500 mL

## 2017-02-08 SURGICAL SUPPLY — 6 items
CABLE SURGICAL S-101-97-12 (CABLE) ×3 IMPLANT
ICD ELLIPSE VR CD1411-36Q (ICD Generator) ×3 IMPLANT
LEAD DURATA 7122Q-65CM (Lead) ×3 IMPLANT
PAD DEFIB LIFELINK (PAD) ×3 IMPLANT
SHEATH CLASSIC 7F (SHEATH) ×3 IMPLANT
TRAY PACEMAKER INSERTION (PACKS) ×3 IMPLANT

## 2017-02-08 NOTE — Progress Notes (Signed)
Sleeping. Waiting on bed assignment

## 2017-02-08 NOTE — Consult Note (Addendum)
WOC Nurse wound consult note Reason for Consult: Consult requested for bilat legs.  Pt states he had cellulitis a few weeks ago and was placed on antibiotics, and affected areas have been improving.  Wound type: Generalized edema and erythremia.  No further blisters or weeping. Right outer leg with partial thickness wound; .8X.8X.1cm, pink and dry.  Patchy areas of dry scabs scattered to legs. Dressing procedure/placement/frequency: Foam dressing to protect and promote healing to right leg.   Please re-consult if further assistance is needed.  Thank-you,  Cammie Mcgee MSN, RN, CWOCN, Mart, CNS [phone]

## 2017-02-08 NOTE — H&P (Signed)
Timothy Green is a 66 y.o. male with a history of systolic heart failure. He presents today for ICD implant. On exam, RRR, no murmurs, lungs clear. His cardiomyopathy has been defined as nonischemic. Risks and benefits discussed. Risks include but not limited to bleeding, infection, tamponade, pneumothorax. The patient understands the risks and has agreed to the procedure.  ICD Criteria  Current LVEF:20-25%. Within 12 months prior to implant: Yes   Heart failure history: Yes, Class II  Cardiomyopathy history: No.  Atrial Fibrillation/Atrial Flutter: No.  Ventricular tachycardia history: No.  Cardiac arrest history: No.  History of syndromes with risk of sudden death: No.  Previous ICD: No.  Current ICD indication: Primary  PPM indication: No.   Class I or II Bradycardia indication present: No  Beta Blocker therapy for 3 or more months: Yes, prescribed.   Ace Inhibitor/ARB therapy for 3 or more months: Yes, prescribed.     Shereka Lafortune Elberta Fortis, MD 02/08/2017 7:10 AM

## 2017-02-08 NOTE — Discharge Summary (Signed)
ELECTROPHYSIOLOGY PROCEDURE DISCHARGE SUMMARY    Patient ID: Timothy Green,  MRN: [id number], DOB/AGE: October 09, 1950 66 y.o.  Admit date: 02/08/2017 Discharge date: 02/09/17  Primary Care Physician: Doreen SalvageBulla, Donald, PA-C  Primary Cardiologist: Dr. Hanley Haysosario Electrophysiologist: Dr. Elberta Fortisamnitz  Primary Discharge Diagnosis:  1. NICM  Secondary Discharge Diagnosis:  1. HTN 2. HLD 3. CVA (old) 4. Chronic CHF      No Known Allergies   Procedures This Admission:  1.  Implantation of a SJM single chamber ICD on 02/08/17 by Dr Elberta Fortisamnitz.  The patient received a a[employer]. Jude Medical [place], model [id number] (serial number [id number]) right ventricular defibrillator lead and [employer] [place] [place] [id number] (serial  Number [id number]) ICD DFT's were deferred at time of implant.   There were no immediate post procedure complications. 2.  CXR on 02/09/17 demonstrated no pneumothorax status post device implantation.   Brief HPI: Timothy Geraldsrthur Nadeem is a 66 y.o. male was referred to electrophysiology in the outpatient setting for consideration of ICD implantation.  Past medical history is noted above.  The patient has persistent LV dysfunction despite guideline directed therapy.  Risks, benefits, and alternatives to ICD implantation were reviewed with the patient who wished to proceed.   Hospital Course:  The patient was admitted and underwent implantation of an ICD with details as outlined above. He was monitored on telemetry overnight which demonstrated SR.  Left chest was without hematoma or ecchymosis.  The device was interrogated and found to be functioning normally.  CXR was obtained and demonstrated no pneumothorax status post device implantation, no c/o SOB, exam notes clear lungs.  Pt is encouraged to be OOB, he has been ambulating without SOB or difficulty.  Wound care, arm mobility, and restrictions were reviewed with the patient.  The patient was examined by Dr. Elberta Fortisamnitz and considered stable  for discharge to home.   The patient's discharge medications include an ARB (Entresto) and beta blocker (carvedilol).   Physical Exam: Vitals:   02/08/17 2315 02/09/17 0000 02/09/17 0243 02/09/17 0715  BP: (!) 141/66 (!) 127/58 (!) 141/75 140/74  Pulse: 74 73 67 69  Resp:  20 12 17   Temp:  97.9 F (36.6 C) 98.9 F (37.2 C) 98.4 F (36.9 C)  TempSrc:  Oral Oral Oral  SpO2: 97% 97% 96% 94%  Weight:      Height:        GEN- The patient is well appearing, alert and oriented x 3 today.   HEENT: normocephalic, atraumatic; sclera clear, conjunctiva pink; hearing intact; oropharynx clear Lungs- CTA b/l, normal work of breathing.  No wheezes, rales, rhonchi Heart- RRR, no murmurs, rubs or gallops, PMI not laterally displaced GI- soft, non-tender, non-distended Extremities- no clubbing, cyanosis, or edema MS- no significant deformity or atrophy Skin- warm and dry, no rash or lesion, left chest without hematoma/ecchymosis Psych- euthymic mood, full affect Neuro- no gross defecits  Labs:   Lab Results  Component Value Date   WBC 7.2 02/02/2017   HGB 12.0 (L) 02/02/2017   HCT 36.1 (L) 02/02/2017   MCV 93 02/02/2017   PLT 211 02/02/2017     Recent Labs Lab 02/02/17 1452  NA 142  K 4.4  CL 103  CO2 28  BUN 29*  CREATININE 1.55*  CALCIUM 8.8  GLUCOSE 243*    Discharge Medications:  Allergies as of 02/09/2017   No Known Allergies     Medication List    TAKE these medications   acetaminophen 500 MG tablet  Commonly known as:  TYLENOL Take 1,000 mg by mouth daily as needed for moderate pain.   atorvastatin 40 MG tablet Commonly known as:  LIPITOR Take 40 mg by mouth every evening.   carvedilol 12.5 MG tablet Commonly known as:  COREG Take 12.5 mg by mouth 2 (two) times daily.   clopidogrel 75 MG tablet Commonly known as:  PLAVIX Take 75 mg by mouth daily.   ENTRESTO 97-103 MG Generic drug:  sacubitril-valsartan Take 1 tablet by mouth 2 (two) times daily.    furosemide 40 MG tablet Commonly known as:  LASIX Take 40 mg by mouth 2 (two) times daily.   glipiZIDE 10 MG tablet Commonly known as:  GLUCOTROL Take 10 mg by mouth daily before breakfast.   MULTIPLE VITAMIN PO Take 1 tablet by mouth daily.   mupirocin ointment 2 % Commonly known as:  BACTROBAN Place 1 application into the nose 2 (two) times daily.   potassium chloride 10 MEQ tablet Commonly known as:  K-DUR Take 10-20 mEq by mouth daily. Alternate taking 20 meq once daily an 10 meq once daily       Disposition:  Home  Discharge Instructions    Diet - low sodium heart healthy    Complete by:  As directed    Increase activity slowly    Complete by:  As directed      Follow-up Information    [facility] Office Follow up on 02/21/2017.   Specialty:  Cardiology Why:  2:00PM, wound check visit Contact information: [address] [place] [address] [phone]       Regan Lemming, MD Follow up on 05/12/2017.   Specialty:  Cardiology Why:  9:30AM Contact information: [address] [place] [address] [phone]           Duration of Discharge Encounter: Greater than 30 minutes including physician time.  SignedFrancis Dowse, PA-C 02/09/2017 9:32 AM  I have seen and examined this patient with Francis Dowse.  Agree with above, note added to reflect my findings.  On exam, RRR, no murmurs, lungs clear.  ICD implanted for primary prevention chronic systolic heart failure.  Device interrogation and chest x-ray without major issue.  Plan for discharge today with follow-up in device clinic.  Saulo Anthis M. Arthor Gorter MD 02/09/2017 10:15 AM

## 2017-02-08 NOTE — Care Management Note (Signed)
Case Management Note  Patient Details  Name: Timothy Green MRN: [id number] Date of Birth: [birth year]  Subjective/Objective:   From home,  pta indep, s/p ICD implant.   He has PCP and medication coverage.                Action/Plan: DC home today, no needs.  Expected Discharge Date:                  Expected Discharge Plan:  Home/Self Care  In-House Referral:     Discharge planning Services  CM Consult  Post Acute Care Choice:    Choice offered to:     DME Arranged:    DME Agency:     HH Arranged:    HH Agency:     Status of Service:  Completed, signed off  If discussed at [employer] of Stay Meetings, dates discussed:    Additional Comments:  Leone Haven, RN 02/08/2017, 3:29 PM

## 2017-02-08 NOTE — Discharge Instructions (Signed)
° ° °  Supplemental Discharge Instructions for  °Pacemaker/Defibrillator Patients ° °Activity °No heavy lifting or vigorous activity with your left/right arm for 6 to 8 weeks.  Do not raise your left/right arm above your head for one week.  Gradually raise your affected arm as drawn below. ° °        °    02/12/17                    02/13/17                    02/14/17                   02/15/17 °__ ° °NO DRIVING for  1 week  ; you may begin driving on   02/15/17  . ° °WOUND CARE °- Keep the wound area clean and dry.  Do not get this area wet, no showers until cleared to at your wound check visit.  The tape/steri-strips on your wound will fall off; do not pull them off.  No bandage is needed on the site.  DO  NOT apply any creams, oils, or ointments to the wound area. °- If you notice any drainage or discharge from the wound, any swelling or bruising at the site, or you develop a fever > 101? F after you are discharged home, call the office at once. ° °Special Instructions °- You are still able to use cellular telephones; use the ear opposite the side where you have your pacemaker/defibrillator.  Avoid carrying your cellular phone near your device. °- When traveling through airports, show security personnel your identification card to avoid being screened in the metal detectors.  Ask the security personnel to use the hand wand. °- Avoid arc welding equipment, MRI testing (magnetic resonance imaging), TENS units (transcutaneous nerve stimulators).  Call the office for questions about other devices. °- Avoid electrical appliances that are in poor condition or are not properly grounded. °- Microwave ovens are safe to be near or to operate. ° °Additional information for defibrillator patients should your device go off: °- If your device goes off ONCE and you feel fine afterward, notify the device clinic nurses. °- If your device goes off ONCE and you do not feel well afterward, call 911. °- If your device goes off TWICE, call  911. °- If your device goes off THREE times in one day, call 911. ° °DO NOT DRIVE YOURSELF OR A FAMILY MEMBER °WITH A DEFIBRILLATOR TO THE HOSPITAL--CALL 911. ° °

## 2017-02-09 ENCOUNTER — Ambulatory Visit (HOSPITAL_COMMUNITY): Payer: Medicare Other

## 2017-02-09 DIAGNOSIS — I255 Ischemic cardiomyopathy: Secondary | ICD-10-CM | POA: Diagnosis not present

## 2017-02-09 DIAGNOSIS — I5022 Chronic systolic (congestive) heart failure: Secondary | ICD-10-CM

## 2017-02-09 DIAGNOSIS — E785 Hyperlipidemia, unspecified: Secondary | ICD-10-CM | POA: Diagnosis not present

## 2017-02-09 DIAGNOSIS — I110 Hypertensive heart disease with heart failure: Secondary | ICD-10-CM | POA: Diagnosis not present

## 2017-02-09 LAB — GLUCOSE, CAPILLARY: Glucose-Capillary: 261 mg/dL — ABNORMAL HIGH (ref 65–99)

## 2017-02-09 MED ORDER — CHLORHEXIDINE GLUCONATE CLOTH 2 % EX PADS
6.0000 | MEDICATED_PAD | Freq: Every day | CUTANEOUS | Status: DC
  Administered 2017-02-09: 06:00:00 6 via TOPICAL

## 2017-02-09 MED ORDER — MUPIROCIN 2 % EX OINT: 1.0000 "application " | TOPICAL_OINTMENT | Freq: Two times a day (BID) | CUTANEOUS | 0 refills | Status: AC

## 2017-02-09 MED ORDER — OFF THE BEAT BOOK
Freq: Once | Status: AC
  Administered 2017-02-09: 1
  Filled 2017-02-09: qty 1

## 2017-02-09 MED ORDER — YOU HAVE A PACEMAKER BOOK
Freq: Once | Status: AC
  Administered 2017-02-09: 08:00:00 1
  Filled 2017-02-09: qty 1

## 2017-02-09 MED ORDER — MUPIROCIN 2 % EX OINT
1.0000 "application " | TOPICAL_OINTMENT | Freq: Two times a day (BID) | CUTANEOUS | Status: DC
  Administered 2017-02-09 (×2): 1 via NASAL

## 2017-02-21 ENCOUNTER — Ambulatory Visit (INDEPENDENT_AMBULATORY_CARE_PROVIDER_SITE_OTHER): Payer: Medicare Other | Admitting: *Deleted

## 2017-02-21 DIAGNOSIS — I5022 Chronic systolic (congestive) heart failure: Secondary | ICD-10-CM | POA: Diagnosis not present

## 2017-02-22 LAB — CUP PACEART INCLINIC DEVICE CHECK
Date Time Interrogation Session: 20181112204619
HighPow Impedance: 41.625
Implantable Lead Implant Date: 20181030
Implantable Lead Location: 753860
Implantable Pulse Generator Implant Date: 20181030
Lead Channel Pacing Threshold Amplitude: 0.75 V
Lead Channel Pacing Threshold Pulse Width: 0.5 ms
Lead Channel Pacing Threshold Pulse Width: 0.5 ms
Lead Channel Setting Pacing Amplitude: 3.5 V
MDC IDC MSMT BATTERY REMAINING LONGEVITY: 104 mo
MDC IDC MSMT LEADCHNL RV IMPEDANCE VALUE: 462.5 Ohm
MDC IDC MSMT LEADCHNL RV PACING THRESHOLD AMPLITUDE: 0.75 V
MDC IDC MSMT LEADCHNL RV SENSING INTR AMPL: 11.9 mV
MDC IDC PG SERIAL: 9795738
MDC IDC SET LEADCHNL RV PACING PULSEWIDTH: 0.5 ms
MDC IDC SET LEADCHNL RV SENSING SENSITIVITY: 0.5 mV
MDC IDC STAT BRADY RV PERCENT PACED: 0 %

## 2017-02-22 NOTE — Progress Notes (Signed)
Wound check appointment. Steri-strips removed. Wound without redness or edema. Incision edges approximated, wound well healed. Normal device function. Threshold, sensing, and impedances consistent with implant measurements. Device programmed at 3.5V for extra safety margin until 3 month visit. Histogram distribution appropriate for patient and level of activity. No ventricular arrhythmias noted. Patient educated about wound care, arm mobility, lifting restrictions, shock plan. ROV in 3 months with WC.

## 2017-05-12 ENCOUNTER — Encounter: Payer: BLUE CROSS/BLUE SHIELD | Admitting: Cardiology

## 2018-12-02 IMAGING — CR DG CHEST 2V
2 series · 2 of 2 positions shown · non-contrast
Comparison: All

CLINICAL DATA: Post defibrillator soreness

EXAM:
CHEST  2 VIEW

[chest pa (1 of 2)]
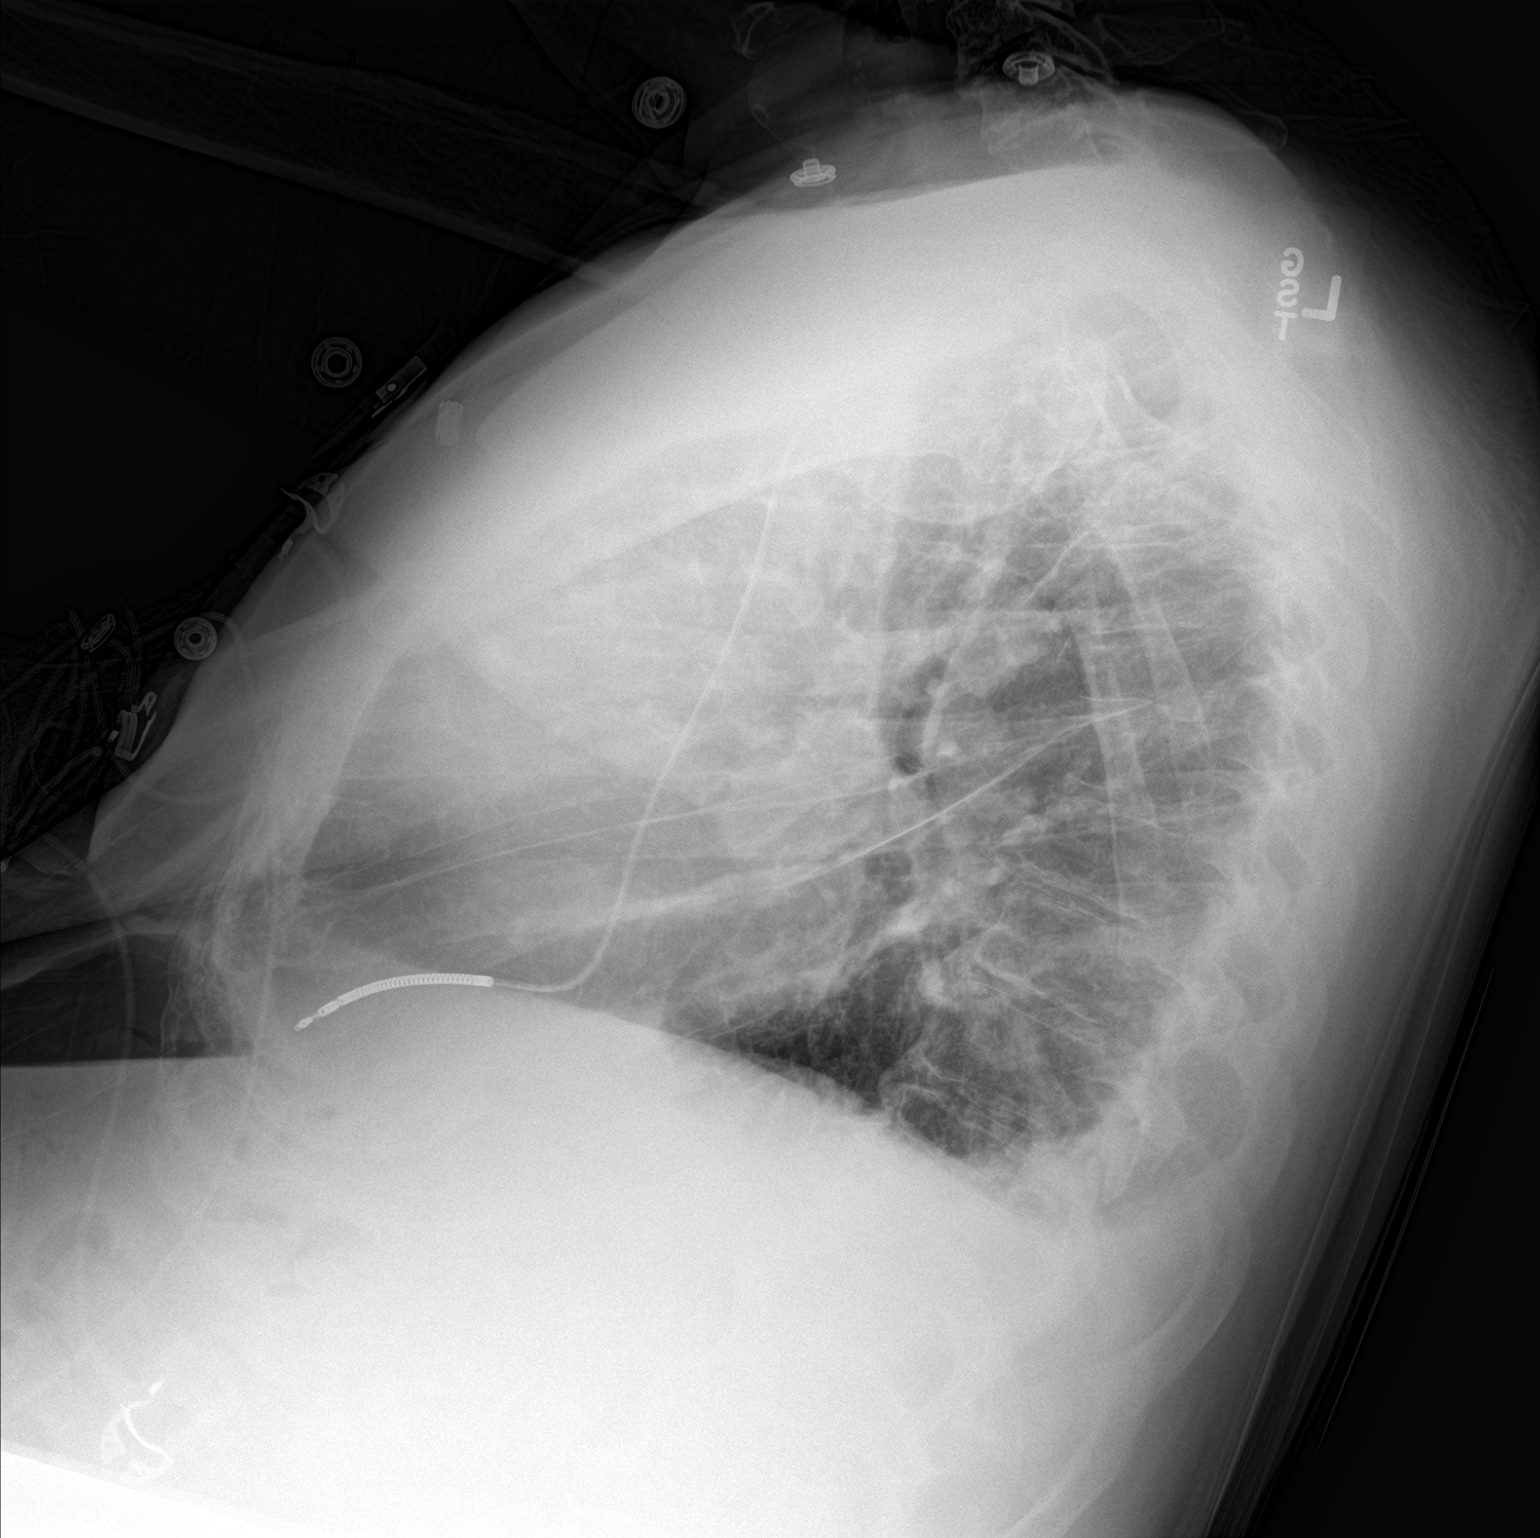

[chest pa (2 of 2)]
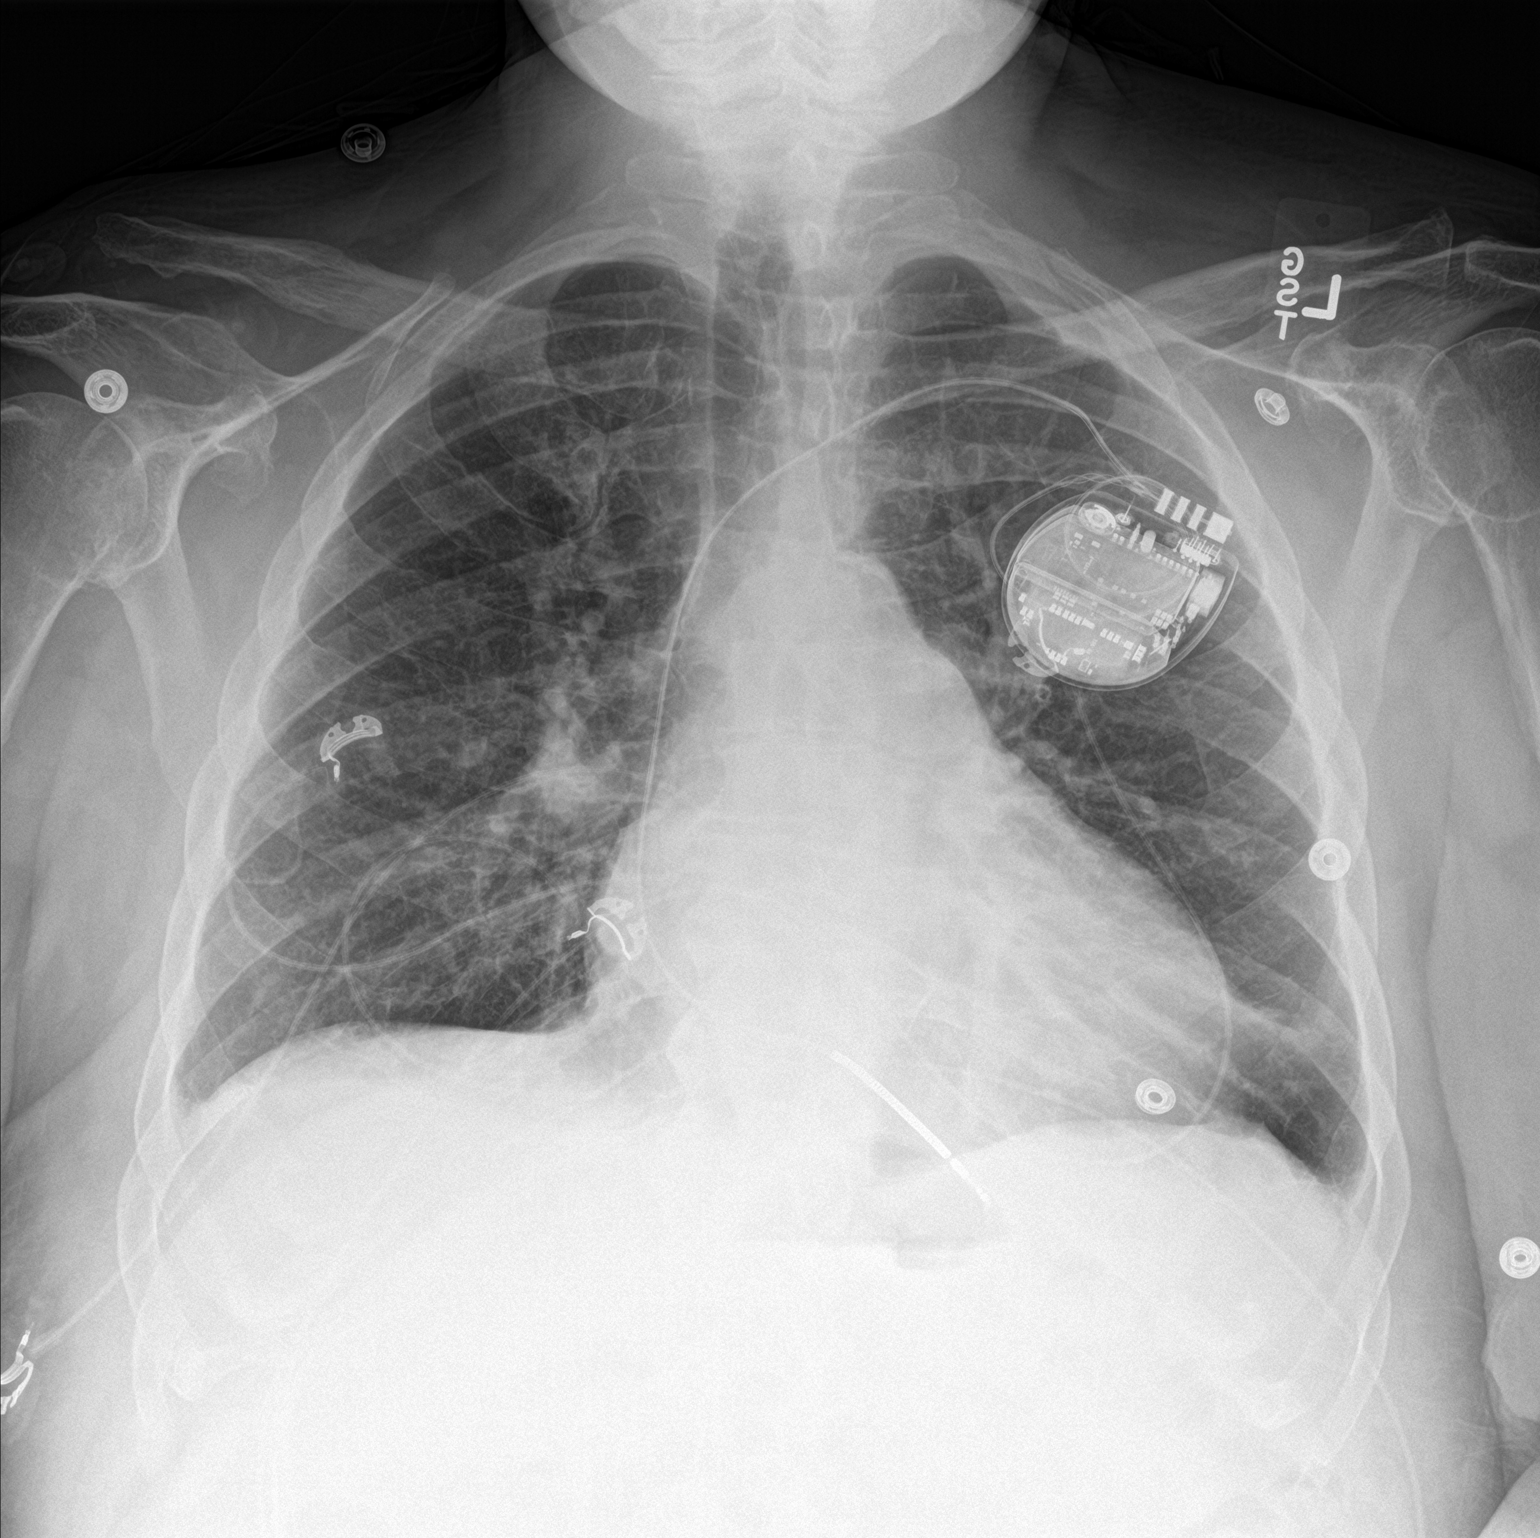

[2 of 2 positions shown; findings below may reference images not displayed]

FINDINGS: LEFT-sided defibrillator with single lead. Normal cardiac
silhouette. Mild LEFT basilar atelectasis. No pneumothorax. No
pulmonary edema. Small effusions present on lateral exam.
IMPRESSION: Minimal LEFT basilar atelectasis.

Small effusions.

## 2024-03-12 DEATH — deceased
